# Patient Record
Sex: Female | Born: 1985 | Race: White | Hispanic: No | Marital: Married | Smoking: Never smoker
Health system: Southern US, Community
[De-identification: ages and names within clinical notes are randomized; demographics above are authoritative.]

## PROBLEM LIST (undated history)

## (undated) DIAGNOSIS — N946 Dysmenorrhea, unspecified: Secondary | ICD-10-CM

## (undated) DIAGNOSIS — F329 Major depressive disorder, single episode, unspecified: Secondary | ICD-10-CM

## (undated) DIAGNOSIS — Z8742 Personal history of other diseases of the female genital tract: Secondary | ICD-10-CM

## (undated) DIAGNOSIS — N920 Excessive and frequent menstruation with regular cycle: Secondary | ICD-10-CM

## (undated) DIAGNOSIS — F419 Anxiety disorder, unspecified: Secondary | ICD-10-CM

## (undated) DIAGNOSIS — F32A Depression, unspecified: Secondary | ICD-10-CM

## (undated) DIAGNOSIS — N941 Unspecified dyspareunia: Secondary | ICD-10-CM

## (undated) DIAGNOSIS — K219 Gastro-esophageal reflux disease without esophagitis: Secondary | ICD-10-CM

## (undated) DIAGNOSIS — Z87898 Personal history of other specified conditions: Secondary | ICD-10-CM

## (undated) DIAGNOSIS — R102 Pelvic and perineal pain: Secondary | ICD-10-CM

## (undated) DIAGNOSIS — Z8659 Personal history of other mental and behavioral disorders: Secondary | ICD-10-CM

## (undated) HISTORY — PX: WISDOM TOOTH EXTRACTION: SHX21

---
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
## 2010-01-11 ENCOUNTER — Emergency Department (HOSPITAL_BASED_OUTPATIENT_CLINIC_OR_DEPARTMENT_OTHER): Admission: EM | Admit: 2010-01-11 | Discharge: 2010-01-11 | Payer: Self-pay | Admitting: Emergency Medicine

## 2011-03-05 ENCOUNTER — Encounter: Payer: Self-pay | Admitting: Emergency Medicine

## 2011-03-05 ENCOUNTER — Emergency Department
Admission: EM | Admit: 2011-03-05 | Discharge: 2011-03-05 | Disposition: A | Discharge status: Home / Self Care | Payer: 59 | Source: Home / Self Care | Attending: Emergency Medicine | Admitting: Emergency Medicine

## 2011-03-05 DIAGNOSIS — N390 Urinary tract infection, site not specified: Secondary | ICD-10-CM

## 2011-03-05 LAB — POCT URINALYSIS DIP (MANUAL ENTRY)
Bilirubin, UA: NEGATIVE
Glucose, UA: NEGATIVE
Ketones, POC UA: NEGATIVE
Nitrite, UA: NEGATIVE
Protein Ur, POC: 100
Spec Grav, UA: >=1.03 (ref 1.005–1.03)
Urobilinogen, UA: 0.2 (ref 0–1)
pH, UA: 6 (ref 5–8)

## 2011-03-05 MED ORDER — CIPROFLOXACIN HCL 500 MG PO TABS: 500.0000 mg | ORAL_TABLET | Freq: Two times a day (BID) | ORAL | Status: AC

## 2011-03-05 MED ORDER — PHENAZOPYRIDINE HCL 200 MG PO TABS: ORAL_TABLET | ORAL | Status: AC

## 2011-03-05 NOTE — ED Provider Notes (Addendum)
History    This is a 26 y.o. female who presents today with UTI symptoms for 2 days.  + dysuria + frequency + urgency Positive, mild hematuria No vaginal discharge No fever/chills Positive, mild lower abdominal pain.  No nausea No vomiting  minimal back pain No fatigue She denies chance of pregnancy. Has tried over-the-counter measures without improvement.   CSN: [id number]  Arrival date & time 03/05/11  5284   First MD Initiated Contact with Patient 03/05/11 0820      Chief Complaint  Patient presents with  . Hematuria    (Consider location/radiation/quality/duration/timing/severity/associated sxs/prior treatment) HPI  History reviewed. No pertinent past medical history.  History reviewed. No pertinent past surgical history.  Family History  Problem Relation Age of Onset  . Cancer Mother     History  Substance Use Topics  . Smoking status: Not on file  . Smokeless tobacco: Not on file  . Alcohol Use:     OB History    Grav Para Term Preterm Abortions TAB SAB Ect Mult Living                  Review of Systems  Allergies  Penicillins  Home Medications   Current Outpatient Rx  Name Route Sig Dispense Refill  . DULOXETINE HCL 60 MG PO CPEP Oral Take 60 mg by mouth daily.    Azzie Roup ACE-ETH ESTRAD-FE 1-20 MG-MCG PO TABS Oral Take 1 tablet by mouth daily.    Marland Kitchen CIPROFLOXACIN HCL 500 MG PO TABS Oral Take 1 tablet (500 mg total) by mouth 2 (two) times daily. 14 tablet 0  . PHENAZOPYRIDINE HCL 200 MG PO TABS  Take 1 tablet by mouth every 6-8 hours if needed for urinary pain 8 tablet 0    BP 115/76  Pulse 73  Temp(Src) 98.5 F (36.9 C) (Oral)  Resp 12  Ht 5\' 7"  (1.702 m)  Wt 152 lb (68.947 kg)  BMI 23.81 kg/m2  SpO2 99%  LMP 02/19/2011  Physical Exam  Nursing note and vitals reviewed. Constitutional: She is oriented to person, place, and time. She appears well-developed and well-nourished. No distress.  HENT:  Mouth/Throat: Oropharynx is  clear and moist.  Eyes: No scleral icterus.  Neck: Neck supple.  Cardiovascular: Normal rate, regular rhythm and normal heart sounds.   Pulmonary/Chest: Breath sounds normal.  Abdominal: Soft. She exhibits no mass. There is no hepatosplenomegaly. There is tenderness in the suprapubic area. There is no rebound, no guarding and no CVA tenderness.  Lymphadenopathy:    She has no cervical adenopathy.  Neurological: She is alert and oriented to person, place, and time.  Skin: Skin is warm and dry.    ED Course  Procedures (including critical care time)   Labs Reviewed  POCT URINALYSIS DIP (MANUAL ENTRY)  URINE CULTURE     1. UTI (lower urinary tract infection)       MDM  No evidence of pyelonephritis or toxemia on exam. Advised to push fluids and other symptomatic care discussed. Cipro and Pyridium prescribed. Followup here or with PCP if no better in 3-4 days, sooner when necessary. Lonell Face, MD 03/05/11 [phone]  Addendum: At time of visit, UA was positive for blood protein and leukocytes. I reviewed this with patient and explained this was consistent with diagnosis of UTI. We'll send off urine culture which is pending.  Lonell Face, MD 03/05/11 [phone]

## 2011-03-05 NOTE — ED Notes (Signed)
Hematuria, dysuria, polyuria, LBP since last night

## 2011-03-07 LAB — URINE CULTURE
Colony Count: NO GROWTH
Organism ID, Bacteria: NO GROWTH

## 2011-04-09 ENCOUNTER — Encounter: Payer: Self-pay | Admitting: *Deleted

## 2011-04-09 ENCOUNTER — Emergency Department
Admission: EM | Admit: 2011-04-09 | Discharge: 2011-04-09 | Disposition: A | Discharge status: Home Health | Payer: 59 | Source: Home / Self Care | Attending: Emergency Medicine | Admitting: Emergency Medicine

## 2011-04-09 DIAGNOSIS — J069 Acute upper respiratory infection, unspecified: Secondary | ICD-10-CM

## 2011-04-09 HISTORY — DX: Depression, unspecified: F32.A

## 2011-04-09 HISTORY — DX: Major depressive disorder, single episode, unspecified: F32.9

## 2011-04-09 LAB — POCT RAPID STREP A (OFFICE): Rapid Strep A Screen: NEGATIVE

## 2011-04-09 MED ORDER — AZITHROMYCIN 250 MG PO TABS: ORAL_TABLET | ORAL | Status: AC

## 2011-04-09 NOTE — ED Notes (Signed)
Pt c/o sore throat, cough, chills, fatigue, nausea and body aches x Sunday. No OTC meds.

## 2011-04-09 NOTE — ED Provider Notes (Signed)
History     CSN: [id number]  Arrival date & time 04/09/11  0272   First MD Initiated Contact with Patient 04/09/11 [phone]      Chief Complaint  Patient presents with  . Cough  . Sore Throat    (Consider location/radiation/quality/duration/timing/severity/associated sxs/prior treatment) HPI Mackenzie Rodgers is a 26 y.o. female who complains of onset of cold symptoms for 3 days.  Did not have flu shot this year.  Has not been taking any meds.  Unknown sick contacts. + sore throat No cough No pleuritic pain No wheezing + nasal congestion + post-nasal drainage +  sinus pain/pressure No chest congestion No itchy/red eyes No earache No hemoptysis No SOB + chills/sweats/ fever (especially last night with hourly chills) No nausea No vomiting No abdominal pain No diarrhea No skin rashes + fatigue + myalgias No headache    Past Medical History  Diagnosis Date  . Depression     History reviewed. No pertinent past surgical history.  Family History  Problem Relation Age of Onset  . Cancer Mother     History  Substance Use Topics  . Smoking status: Never Smoker   . Smokeless tobacco: Not on file  . Alcohol Use: No    OB History    Grav Para Term Preterm Abortions TAB SAB Ect Mult Living                  Review of Systems  All other systems reviewed and are negative.    Allergies  Penicillins  Home Medications   Current Outpatient Rx  Name Route Sig Dispense Refill  . AZITHROMYCIN 250 MG PO TABS  Use as directed 1 each 0  . DULOXETINE HCL 60 MG PO CPEP Oral Take 60 mg by mouth daily.    Azzie Roup ACE-ETH ESTRAD-FE 1-20 MG-MCG PO TABS Oral Take 1 tablet by mouth daily.      BP 108/72  Pulse 92  Temp(Src) 98.5 F (36.9 C) (Oral)  Resp 16  Ht 5\' 7"  (1.702 m)  Wt 155 lb 8 oz (70.534 kg)  BMI 24.35 kg/m2  SpO2 98%  LMP 03/23/2011  Physical Exam  Nursing note and vitals reviewed. Constitutional: She is oriented to person, place, and time. She appears  well-developed and well-nourished.  HENT:  Head: Normocephalic and atraumatic.  Right Ear: Tympanic membrane, external ear and ear canal normal.  Left Ear: Tympanic membrane, external ear and ear canal normal.  Nose: Mucosal edema and rhinorrhea present.  Mouth/Throat: Posterior oropharyngeal erythema present. No oropharyngeal exudate or posterior oropharyngeal edema.  Eyes: No scleral icterus.  Neck: Neck supple.  Cardiovascular: Regular rhythm and normal heart sounds.   Pulmonary/Chest: Effort normal and breath sounds normal. No respiratory distress.  Neurological: She is alert and oriented to person, place, and time.  Skin: Skin is warm and dry.  Psychiatric: She has a normal mood and affect. Her speech is normal.    ED Course  Procedures (including critical care time)   Labs Reviewed  POCT RAPID STREP A (OFFICE)   No results found.   1. Acute upper respiratory infections of unspecified site   2. Influenza-like illness       MDM  1)  Take the prescribed antibiotic as instructed. Likely is still viral, so should wait a few days prior to taking Zpak.  Has been too long for Tamiflu to work.  Rapid strep negative. 2)  Use nasal saline solution (over the counter) at least 3 times a day. 3)  Use over the counter decongestants like Zyrtec-D every 12 hours as needed to help with congestion.  If you have hypertension, do not take medicines with sudafed.  4)  Can take tylenol every 6 hours or motrin every 8 hours for pain or fever. 5)  Follow up with your primary doctor if no improvement in 5-7 days, sooner if increasing pain, fever, or new symptoms.       Lily Kocher, MD 04/09/11 1131

## 2011-04-11 ENCOUNTER — Emergency Department
Admission: EM | Admit: 2011-04-11 | Discharge: 2011-04-11 | Disposition: A | Discharge status: Home Health | Payer: 59 | Source: Home / Self Care | Attending: Family Medicine | Admitting: Family Medicine

## 2011-04-11 ENCOUNTER — Encounter: Payer: Self-pay | Admitting: *Deleted

## 2011-04-11 DIAGNOSIS — J209 Acute bronchitis, unspecified: Secondary | ICD-10-CM

## 2011-04-11 MED ORDER — BENZONATATE 200 MG PO CAPS: 200.0000 mg | ORAL_CAPSULE | Freq: Every day | ORAL | Status: AC

## 2011-04-11 MED ORDER — CLARITHROMYCIN 500 MG PO TABS: 500.0000 mg | ORAL_TABLET | Freq: Two times a day (BID) | ORAL | Status: AC

## 2011-04-11 NOTE — Discharge Instructions (Signed)
Stop azithromycin. Take Mucinex D (guaifenesin with decongestant) twice daily for congestion.  Increase fluid intake, rest. May use Afrin nasal spray (or generic oxymetazoline) twice daily for about 5 days.  Also recommend using saline nasal spray several times daily and saline nasal irrigation (AYR is a common brand) Stop all antihistamines for now, and other non-prescription cough/cold preparations. May take Ibuprofen 200mg , 4 tabs every 8 hours with food for chest/sternum discomfort. Recommend Tdap when well.

## 2011-04-11 NOTE — ED Provider Notes (Signed)
History     CSN: [id number]  Arrival date & time 04/11/11  0941   First MD Initiated Contact with Patient 04/11/11 1010      Chief Complaint  Patient presents with  . Cough      HPI Comments: Patient complains of continued symptoms, including fatigue and nausea (without vomiting).  She now has increased sinus congestion and ears feel clogged.  She developed a generally non productive cough yesterday, worse at night.  She has occasional wheezing and shortness of breath with activity.  No pleuritic pain.  She does not remember her last tetanus shot.   The history is provided by the patient.    Past Medical History  Diagnosis Date  . Depression     History reviewed. No pertinent past surgical history.  Family History  Problem Relation Age of Onset  . Cancer Mother     History  Substance Use Topics  . Smoking status: Never Smoker   . Smokeless tobacco: Not on file  . Alcohol Use: No    OB History    Grav Para Term Preterm Abortions TAB SAB Ect Mult Living                  Review of Systems + sore throat, resolved + cough No pleuritic pain No wheezing + nasal congestion + post-nasal drainage No sinus pain/pressure No itchy/red eyes ? Earache (feel clogged) No hemoptysis No SOB No fever, + chills + nausea No vomiting No abdominal pain No diarrhea, but loose stools No urinary symptoms No skin rashes + fatigue No myalgias + headache Used OTC meds without relief  Allergies  Penicillins  Home Medications   Current Outpatient Rx  Name Route Sig Dispense Refill  . AZITHROMYCIN 250 MG PO TABS  Use as directed 1 each 0  . BENZONATATE 200 MG PO CAPS Oral Take 1 capsule (200 mg total) by mouth at bedtime. Take as needed for cough 12 capsule 0  . CLARITHROMYCIN 500 MG PO TABS Oral Take 1 tablet (500 mg total) by mouth 2 (two) times daily. Take for one week 14 tablet 0  . DULOXETINE HCL 60 MG PO CPEP Oral Take 60 mg by mouth daily.    Azzie Roup ACE-ETH  ESTRAD-FE 1-20 MG-MCG PO TABS Oral Take 1 tablet by mouth daily.      BP 107/71  Pulse 84  Temp(Src) 98.7 F (37.1 C) (Oral)  Resp 16  Ht 5\' 7"  (1.702 m)  Wt 155 lb 12 oz (70.648 kg)  BMI 24.39 kg/m2  SpO2 98%  LMP 03/23/2011  Physical Exam Nursing notes and Vital Signs reviewed. Appearance:  Patient appears healthy, stated age, and in no acute distress Eyes:  Pupils are equal, round, and reactive to light and accomodation.  Extraocular movement is intact.  Conjunctivae are not inflamed  Ears:  Canals normal.  Tympanic membranes normal.  Nose:  Mildly congested turbinates.  No sinus tenderness.   Pharynx:  Normal Neck:  Supple.  Slightly tender shotty posterior nodes are palpated bilaterally  Lungs:  Clear to auscultation.  Breath sounds are equal.  Chest:   Mild tenderness to palpation over the mid-sternum.  Heart:  Regular rate and rhythm without murmurs, rubs, or gallops.  Abdomen:  Nontender without masses or hepatosplenomegaly.  Bowel sounds are present.  No CVA or flank tenderness.  Extremities:  No edema.  No calf tenderness Skin:  No rash present.   ED Course  Procedures none  1. Acute bronchitis; ? pertussis      MDM  Begin Biaxin, and Tessalon at bedtime Stop azithromycin. Take Mucinex D (guaifenesin with decongestant) twice daily for congestion.  Increase fluid intake, rest. May use Afrin nasal spray (or generic oxymetazoline) twice daily for about 5 days.  Also recommend using saline nasal spray several times daily and saline nasal irrigation (AYR is a common brand) Stop all antihistamines for now, and other non-prescription cough/cold preparations. May take Ibuprofen 200mg , 4 tabs every 8 hours with food for chest/sternum discomfort. Recommend Tdap when well. Followup with PCP if not improved one week        Donna Christen, MD 04/12/11 [phone]

## 2011-04-11 NOTE — ED Notes (Signed)
Pt c/o dry cough, nasal congestion, HA,ears popping,and eyes hurt x 5 days. Was seen on 04/09/2011 by Dr Orson Aloe, still no improvement. She is on day 3 of a Zpak. She has also taken Tylenol cold.

## 2011-04-13 ENCOUNTER — Telehealth: Payer: Self-pay | Admitting: Family Medicine

## 2011-07-08 ENCOUNTER — Encounter (HOSPITAL_BASED_OUTPATIENT_CLINIC_OR_DEPARTMENT_OTHER): Payer: Self-pay | Admitting: *Deleted

## 2011-07-08 ENCOUNTER — Emergency Department (HOSPITAL_BASED_OUTPATIENT_CLINIC_OR_DEPARTMENT_OTHER)
Admission: EM | Admit: 2011-07-08 | Discharge: 2011-07-08 | Disposition: A | Discharge status: Home / Self Care | Payer: 59 | Attending: Emergency Medicine | Admitting: Emergency Medicine

## 2011-07-08 DIAGNOSIS — F3289 Other specified depressive episodes: Secondary | ICD-10-CM | POA: Insufficient documentation

## 2011-07-08 DIAGNOSIS — R112 Nausea with vomiting, unspecified: Secondary | ICD-10-CM | POA: Insufficient documentation

## 2011-07-08 DIAGNOSIS — F410 Panic disorder [episodic paroxysmal anxiety] without agoraphobia: Secondary | ICD-10-CM | POA: Insufficient documentation

## 2011-07-08 DIAGNOSIS — R197 Diarrhea, unspecified: Secondary | ICD-10-CM | POA: Insufficient documentation

## 2011-07-08 DIAGNOSIS — F329 Major depressive disorder, single episode, unspecified: Secondary | ICD-10-CM | POA: Insufficient documentation

## 2011-07-08 DIAGNOSIS — R110 Nausea: Secondary | ICD-10-CM

## 2011-07-08 LAB — COMPREHENSIVE METABOLIC PANEL
ALT: 11 U/L (ref 0–35)
AST: 19 U/L (ref 0–37)
Albumin: 3.7 g/dL (ref 3.5–5.2)
Alkaline Phosphatase: 53 U/L (ref 39–117)
BUN: 4 mg/dL — ABNORMAL LOW (ref 6–23)
CO2: 26 mEq/L (ref 19–32)
Calcium: 9 mg/dL (ref 8.4–10.5)
Chloride: 106 mEq/L (ref 96–112)
Creatinine, Ser: 0.7 mg/dL (ref 0.50–1.10)
GFR calc Af Amer: >90 mL/min (ref >90)
GFR calc non Af Amer: >90 mL/min (ref >90)
Glucose, Bld: 86 mg/dL (ref 70–99)
Potassium: 4.1 mEq/L (ref 3.5–5.1)
Sodium: 139 mEq/L (ref 135–145)
Total Bilirubin: 0.3 mg/dL (ref 0.3–1.2)
Total Protein: 7.3 g/dL (ref 6.0–8.3)

## 2011-07-08 LAB — URINALYSIS, ROUTINE W REFLEX MICROSCOPIC
Bilirubin Urine: NEGATIVE
Glucose, UA: NEGATIVE mg/dL
Hgb urine dipstick: NEGATIVE
Ketones, ur: NEGATIVE mg/dL
Leukocytes, UA: NEGATIVE
Nitrite: NEGATIVE
Protein, ur: NEGATIVE mg/dL
Specific Gravity, Urine: 1.006 (ref 1.005–1.030)
Urobilinogen, UA: 0.2 mg/dL (ref 0.0–1.0)
pH: 8 (ref 5.0–8.0)

## 2011-07-08 LAB — CBC
HCT: 37.4 % (ref 36.0–46.0)
Hemoglobin: 12.9 g/dL (ref 12.0–15.0)
MCH: 31.7 pg (ref 26.0–34.0)
MCHC: 34.5 g/dL (ref 30.0–36.0)
MCV: 91.9 fL (ref 78.0–100.0)
Platelets: 260 10*3/uL (ref 150–400)
RBC: 4.07 MIL/uL (ref 3.87–5.11)
RDW: 11.8 % (ref 11.5–15.5)
WBC: 6.2 10*3/uL (ref 4.0–10.5)

## 2011-07-08 LAB — DIFFERENTIAL
Basophils Absolute: 0 10*3/uL (ref 0.0–0.1)
Basophils Relative: 0 % (ref 0–1)
Eosinophils Absolute: 0 10*3/uL (ref 0.0–0.7)
Eosinophils Relative: 1 % (ref 0–5)
Lymphocytes Relative: 19 % (ref 12–46)
Lymphs Abs: 1.2 10*3/uL (ref 0.7–4.0)
Monocytes Absolute: 0.5 10*3/uL (ref 0.1–1.0)
Monocytes Relative: 9 % (ref 3–12)
Neutro Abs: 4.4 10*3/uL (ref 1.7–7.7)
Neutrophils Relative %: 71 % (ref 43–77)

## 2011-07-08 LAB — LIPASE, BLOOD: Lipase: 32 U/L (ref 11–59)

## 2011-07-08 LAB — PREGNANCY, URINE: Preg Test, Ur: NEGATIVE

## 2011-07-08 MED ORDER — ONDANSETRON HCL 4 MG/2ML IJ SOLN
4.0000 mg | Freq: Once | INTRAMUSCULAR | Status: AC
  Administered 2011-07-08: 4 mg via INTRAVENOUS
  Filled 2011-07-08: qty 2

## 2011-07-08 MED ORDER — ONDANSETRON 8 MG PO TBDP: 8.0000 mg | ORAL_TABLET | Freq: Three times a day (TID) | ORAL | Status: AC | PRN

## 2011-07-08 MED ORDER — SODIUM CHLORIDE 0.9 % IV BOLUS (SEPSIS)
1000.0000 mL | Freq: Once | INTRAVENOUS | Status: AC
  Administered 2011-07-08: 1000 mL via INTRAVENOUS

## 2011-07-08 NOTE — ED Provider Notes (Signed)
History     CSN: [id number]  Arrival date & time 07/08/11  7829   First MD Initiated Contact with Patient 07/08/11 1021      Chief Complaint  Patient presents with  . Nausea  . Diarrhea    (Consider location/radiation/quality/duration/timing/severity/associated sxs/prior treatment) HPI Patient is a 26 yo female who presents today complaining of nausea, vomiting, and diarrhea that began while camping.  No one else on the trip became ill.  Patient denies fevers but came in today as she felt that she must be dehydrated.  Also patient has a history of anxiety and this is often triggered by nausea.  Patient denies SI or HI.  The patient has no abdominal or back pain.  She denies cough, chest pain, or shortness of breath.  Patient denies any vaginal discharge or urinary symptoms.  Patient has not had vomiting or diarrhea for 12 hours.  She has had nausea and is currently still nauseas.  Patient reports not being able to tolerate po intake for the past 36 hours.There are no other associated or modifying factors.  Past Medical History  Diagnosis Date  . Depression   . Panic attacks   . Palpitations     negative workup  . Reflux     Past Surgical History  Procedure Date  . Wisdom tooth extraction     Family History  Problem Relation Age of Onset  . Cancer Mother     History  Substance Use Topics  . Smoking status: Never Smoker   . Smokeless tobacco: Not on file  . Alcohol Use: No    OB History    Grav Para Term Preterm Abortions TAB SAB Ect Mult Living                  Review of Systems  Constitutional: Negative.   HENT: Negative.   Eyes: Negative.   Respiratory: Negative.   Cardiovascular: Negative.   Gastrointestinal: Positive for nausea, vomiting and diarrhea.  Genitourinary: Negative.   Musculoskeletal: Negative.   Skin: Negative.   Neurological: Negative.   Hematological: Negative.   Psychiatric/Behavioral: Negative.   All other systems reviewed and are  negative.    Allergies  Penicillins  Home Medications   Current Outpatient Rx  Name Route Sig Dispense Refill  . ALPRAZOLAM 0.25 MG PO TABS Oral Take 0.25 mg by mouth at bedtime as needed.    . DULOXETINE HCL 60 MG PO CPEP Oral Take 60 mg by mouth daily.    Azzie Roup ACE-ETH ESTRAD-FE 1-20 MG-MCG PO TABS Oral Take 1 tablet by mouth daily.    Marland Kitchen ONDANSETRON 8 MG PO TBDP Oral Take 1 tablet (8 mg total) by mouth every 8 (eight) hours as needed for nausea. 20 tablet 0    BP 100/64  Pulse 92  Temp(Src) 98.7 F (37.1 C) (Oral)  Resp 20  SpO2 99%  LMP 07/07/2011  Physical Exam  Nursing note and vitals reviewed. GEN: Well-developed, well-nourished female in no distress HEENT: Atraumatic, normocephalic.  EYES: PERRLA BL, no scleral icterus. NECK: Trachea midline, no meningismus CV: regular rate and rhythm. No murmurs, rubs, or gallops PULM: No respiratory distress.  No crackles, wheezes, or rales. GI: soft, non-tender. No guarding, rebound, or tenderness. + bowel sounds  GU: deferred Neuro: cranial nerves 2-12 intact, no abnormalities of strength or sensation, A and O x 3 MSK: Patient moves all 4 extremities symmetrically, no deformity, edema, or injury noted Skin: No rashes petechiae, purpura, or jaundice Psych: anxious,  denies SI or HI   ED Course  Procedures (including critical care time)  Labs Reviewed  COMPREHENSIVE METABOLIC PANEL - Abnormal; Notable for the following:    BUN 4 (*)    All other components within normal limits  URINALYSIS, ROUTINE W REFLEX MICROSCOPIC  PREGNANCY, URINE  CBC  DIFFERENTIAL  LIPASE, BLOOD   No results found.   1. Nausea   2. Diarrhea       MDM  Patient was evaluated by myself.  She was concerned over possible dehydration but was hemodynamically stable.  She was given IV zofran and NS.  Patient had completely normal labs including CBC, CMP, lipase, urinalysis, and urine preg.  Patient required an additional dose of zofran but  was feeling better after this and able to tolerate po.  Patient was discharged home with a prescription for zofran as needed.  Patient was discharged in good condition.        Cyndra Numbers, MD 07/08/11 2306

## 2011-07-08 NOTE — Discharge Instructions (Signed)
Diarrhea Infections caused by germs (bacterial) or a virus commonly cause diarrhea. Your caregiver has determined that with time, rest and fluids, the diarrhea should improve. In general, eat normally while drinking more water than usual. Although water may prevent dehydration, it does not contain salt and minerals (electrolytes). Broths, weak tea without caffeine and oral rehydration solutions (ORS) replace fluids and electrolytes. Small amounts of fluids should be taken frequently. Large amounts at one time may not be tolerated. Plain water may be harmful in infants and the elderly. Oral rehydrating solutions (ORS) are available at pharmacies and grocery stores. ORS replace water and important electrolytes in proper proportions. Sports drinks are not as effective as ORS and may be harmful due to sugars worsening diarrhea.  ORS is especially recommended for use in children with diarrhea. As a general guideline for children, replace any new fluid losses from diarrhea and/or vomiting with ORS as follows:   If your child weighs 22 pounds or under (10 kg or less), give 60-120 mL ( -  cup or 2 - 4 ounces) of ORS for each episode of diarrheal stool or vomiting episode.   If your child weighs more than 22 pounds (more than 10 kgs), give 120-240 mL ( - 1 cup or 4 - 8 ounces) of ORS for each diarrheal stool or episode of vomiting.   While correcting for dehydration, children should eat normally. However, foods high in sugar should be avoided because this may worsen diarrhea. Large amounts of carbonated soft drinks, juice, gelatin desserts and other highly sugared drinks should be avoided.   After correction of dehydration, other liquids that are appealing to the child may be added. Children should drink small amounts of fluids frequently and fluids should be increased as tolerated. Children should drink enough fluids to keep urine clear or pale yellow.   Adults should eat normally while drinking more fluids  than usual. Drink small amounts of fluids frequently and increase as tolerated. Drink enough fluids to keep urine clear or pale yellow. Broths, weak decaffeinated tea, lemon lime soft drinks (allowed to go flat) and ORS replace fluids and electrolytes.   Avoid:   Carbonated drinks.   Juice.   Extremely hot or cold fluids.   Caffeine drinks.   Fatty, greasy foods.   Alcohol.   Tobacco.   Too much intake of anything at one time.   Gelatin desserts.   Probiotics are active cultures of beneficial bacteria. They may lessen the amount and number of diarrheal stools in adults. Probiotics can be found in yogurt with active cultures and in supplements.   Wash hands well to avoid spreading bacteria and virus.   Anti-diarrheal medications are not recommended for infants and children.   Only take over-the-counter or prescription medicines for pain, discomfort or fever as directed by your caregiver. Do not give aspirin to children because it may cause Reye's Syndrome.   For adults, ask your caregiver if you should continue all prescribed and over-the-counter medicines.   If your caregiver has given you a follow-up appointment, it is very important to keep that appointment. Not keeping the appointment could result in a chronic or permanent injury, and disability. If there is any problem keeping the appointment, you must call back to this facility for assistance.  SEEK IMMEDIATE MEDICAL CARE IF:   You or your child is unable to keep fluids down or other symptoms or problems become worse in spite of treatment.   Vomiting or diarrhea develops and becomes persistent.     There is vomiting of blood or bile (green material).   There is blood in the stool or the stools are black and tarry.   There is no urine output in 6-8 hours or there is only a small amount of very dark urine.   Abdominal pain develops, increases or localizes.   You have a fever.   Your baby is older than 3 months with a  rectal temperature of 102 F (38.9 C) or higher.   Your baby is 48 months old or younger with a rectal temperature of 100.4 F (38 C) or higher.   You or your child develops excessive weakness, dizziness, fainting or extreme thirst.   You or your child develops a rash, stiff neck, severe headache or become irritable or sleepy and difficult to awaken.  MAKE SURE YOU:   Understand these instructions.   Will watch your condition.   Will get help right away if you are not doing well or get worse.  Document Released: 01/17/2002 Document Revised: 01/16/2011 Document Reviewed: 12/04/2008 [facility] Patient Information 2012 [place], [place].Nausea, Adult Nausea is the feeling that you have an upset stomach or have to vomit. Nausea by itself is not likely a serious concern, but it may be an early sign of more serious medical problems. As nausea gets worse, it can lead to vomiting. If vomiting develops, there is the risk of dehydration.  CAUSES   Viral infections.   Food poisoning.   Medicines.   Pregnancy.   Motion sickness.   Migraine headaches.   Emotional distress.   Severe pain from any source.   Alcohol intoxication.  HOME CARE INSTRUCTIONS  Get plenty of rest.   Ask your caregiver about specific rehydration instructions.   Eat small amounts of food and sip liquids more often.   Take all medicines as told by your caregiver.  SEEK MEDICAL CARE IF:  You have not improved after 2 days, or you get worse.   You have a headache.  SEEK IMMEDIATE MEDICAL CARE IF:   You have a fever.   You faint.   You keep vomiting or have blood in your vomit.   You are extremely weak or dehydrated.   You have dark or bloody stools.   You have severe chest or abdominal pain.  MAKE SURE YOU:  Understand these instructions.   Will watch your condition.   Will get help right away if you are not doing well or get worse.  Document Released: 03/06/2004 Document Revised: 01/16/2011  Document Reviewed: 10/09/2010 [facility] Patient Information 2012 [place], [place].

## 2011-07-08 NOTE — ED Notes (Signed)
Patient states she was camping on Saturday night and developed a sudden onset of nausea, vomiting and diarrhea.  Came home and continued with diarrhea on Sunday morning.  States she has had very little liquids since due to feeling like she like she is going to vomit.  States all of this had caused her to have a panic attack.  States today, she feels like she is going to pass out and that her nerves will not let her eat or drink anything.

## 2012-05-27 ENCOUNTER — Emergency Department
Admission: EM | Admit: 2012-05-27 | Discharge: 2012-05-27 | Disposition: A | Discharge status: Home / Self Care | Payer: 59 | Source: Home / Self Care | Attending: Family Medicine | Admitting: Family Medicine

## 2012-05-27 ENCOUNTER — Encounter: Payer: Self-pay | Admitting: Emergency Medicine

## 2012-05-27 DIAGNOSIS — N3000 Acute cystitis without hematuria: Secondary | ICD-10-CM

## 2012-05-27 DIAGNOSIS — R300 Dysuria: Secondary | ICD-10-CM

## 2012-05-27 LAB — POCT URINALYSIS DIP (MANUAL ENTRY)

## 2012-05-27 LAB — POCT URINE PREGNANCY: Preg Test, Ur: NEGATIVE

## 2012-05-27 MED ORDER — NITROFURANTOIN MONOHYD MACRO 100 MG PO CAPS: 100.0000 mg | ORAL_CAPSULE | Freq: Two times a day (BID) | ORAL | Status: DC

## 2012-05-27 MED ORDER — PHENAZOPYRIDINE HCL 200 MG PO TABS: 200.0000 mg | ORAL_TABLET | Freq: Three times a day (TID) | ORAL | Status: DC

## 2012-05-27 NOTE — ED Notes (Signed)
Dysuria, polyuria, x 3 days

## 2012-05-27 NOTE — ED Provider Notes (Signed)
History     CSN: [id number]  Arrival date & time 05/27/12  1818   First MD Initiated Contact with Patient 05/27/12 1850      Chief Complaint  Patient presents with  . Dysuria       HPI Comments: Patient complains of dysuria, frequency, urgency, and hesitancy for about 4 to 5 days, becoming worse.  No nocturia.  No abdominal pain.  No fevers, chills, and sweats   Patient is a 27 y.o. female presenting with dysuria. The history is provided by the patient.  Dysuria  This is a new problem. Episode onset: 4 to 5 days ago. The problem occurs every urination. The problem has been gradually worsening. The quality of the pain is described as burning. The pain is mild. There has been no fever. There is no history of pyelonephritis. Associated symptoms include frequency, hesitancy, urgency and flank pain. Pertinent negatives include no chills, no sweats, no nausea, no vomiting, no discharge and no hematuria. Treatments tried: Pyridium. Her past medical history does not include recurrent UTIs.    Past Medical History  Diagnosis Date  . Depression   . Panic attacks   . Palpitations     negative workup  . Reflux     Past Surgical History  Procedure Laterality Date  . Wisdom tooth extraction      Family History  Problem Relation Age of Onset  . Cancer Mother     History  Substance Use Topics  . Smoking status: Never Smoker   . Smokeless tobacco: Not on file  . Alcohol Use: No    OB History   Grav Para Term Preterm Abortions TAB SAB Ect Mult Living                  Review of Systems  Constitutional: Negative for chills.  Gastrointestinal: Negative for nausea and vomiting.  Genitourinary: Positive for dysuria, hesitancy, urgency, frequency and flank pain. Negative for hematuria.  All other systems reviewed and are negative.    Allergies  Penicillins  Home Medications   Current Outpatient Rx  Name  Route  Sig  Dispense  Refill  . ALPRAZolam (XANAX) 0.25 MG tablet  Oral   Take 0.25 mg by mouth at bedtime as needed.         . DULoxetine (CYMBALTA) 60 MG capsule   Oral   Take 60 mg by mouth daily.         . nitrofurantoin, macrocrystal-monohydrate, (MACROBID) 100 MG capsule   Oral   Take 1 capsule (100 mg total) by mouth 2 (two) times daily.   14 capsule   0   . norethindrone-ethinyl estradiol (JUNEL FE,GILDESS FE,LOESTRIN FE) 1-20 MG-MCG tablet   Oral   Take 1 tablet by mouth daily.         . phenazopyridine (PYRIDIUM) 200 MG tablet   Oral   Take 1 tablet (200 mg total) by mouth 3 (three) times daily. Take with food.   6 tablet   0     BP 117/80  Pulse 67  Temp(Src) 98.2 F (36.8 C) (Oral)  Ht 5\' 7"  (1.702 m)  Wt 136 lb (61.689 kg)  BMI 21.3 kg/m2  SpO2 99%  Physical Exam Nursing notes and Vital Signs reviewed. Appearance:  Patient appears healthy, stated age, and in no acute distress Eyes:  Pupils are equal, round, and reactive to light and accomodation.  Extraocular movement is intact.  Conjunctivae are not inflamed  Pharynx:  Normal; moist mucous membranes  Neck:  Supple.  No adenopathy Lungs:  Clear to auscultation.  Breath sounds are equal.  Heart:  Regular rate and rhythm without murmurs, rubs, or gallops.  Abdomen:  Nontender without masses or hepatosplenomegaly.  Bowel sounds are present.  No CVA or flank tenderness.  Skin:  No rash present.   ED Course  Procedures  none  Labs Reviewed  POCT URINALYSIS DIP (MANUAL ENTRY) GLU 100mg /dL; KET 15mg /dL; BLO moderate; PRO 30mg /dL; NIT positive; LEU large  POCT URINE PREGNANCY negative      1. Dysuria   2. Acute cystitis       MDM  Urine culture pending Begin Macrobid.  Pyridium for two days.  Increase fluid intake. Followup with Family Doctor if not improved in one week.  If symptoms become significantly worse during the night or over the weekend, proceed to the local emergency room.         Lattie Haw, MD 05/27/12 [phone]

## 2012-05-29 ENCOUNTER — Telehealth: Payer: Self-pay | Admitting: *Deleted

## 2012-05-29 LAB — URINE CULTURE
Colony Count: NO GROWTH
Organism ID, Bacteria: NO GROWTH

## 2012-06-15 ENCOUNTER — Telehealth: Payer: Self-pay | Admitting: *Deleted

## 2012-06-18 ENCOUNTER — Encounter: Payer: Self-pay | Admitting: *Deleted

## 2012-06-18 ENCOUNTER — Emergency Department
Admission: EM | Admit: 2012-06-18 | Discharge: 2012-06-18 | Disposition: A | Discharge status: Home / Self Care | Payer: 59 | Source: Home / Self Care | Attending: Family Medicine | Admitting: Family Medicine

## 2012-06-18 DIAGNOSIS — R319 Hematuria, unspecified: Secondary | ICD-10-CM

## 2012-06-18 DIAGNOSIS — R300 Dysuria: Secondary | ICD-10-CM

## 2012-06-18 LAB — CBC WITH DIFFERENTIAL/PLATELET
Basophils Absolute: 0 10*3/uL (ref 0.0–0.1)
Basophils Relative: 0 % (ref 0–1)
Eosinophils Absolute: 0 10*3/uL (ref 0.0–0.7)
Eosinophils Relative: 1 % (ref 0–5)
HCT: 38.5 % (ref 36.0–46.0)
Hemoglobin: 13.2 g/dL (ref 12.0–15.0)
Lymphocytes Relative: 16 % (ref 12–46)
Lymphs Abs: 1.4 10*3/uL (ref 0.7–4.0)
MCH: 31.6 pg (ref 26.0–34.0)
MCHC: 34.3 g/dL (ref 30.0–36.0)
MCV: 92.1 fL (ref 78.0–100.0)
Monocytes Absolute: 0.6 10*3/uL (ref 0.1–1.0)
Monocytes Relative: 7 % (ref 3–12)
Neutro Abs: 6.7 10*3/uL (ref 1.7–7.7)
Neutrophils Relative %: 76 % (ref 43–77)
Platelets: 281 10*3/uL (ref 150–400)
RBC: 4.18 MIL/uL (ref 3.87–5.11)
RDW: 12.7 % (ref 11.5–15.5)
WBC: 8.8 10*3/uL (ref 4.0–10.5)

## 2012-06-18 LAB — POCT URINALYSIS DIP (MANUAL ENTRY)
Glucose, UA: NEGATIVE
Nitrite, UA: NEGATIVE
Protein Ur, POC: >=300
Spec Grav, UA: >=1.03 (ref 1.005–1.03)
Urobilinogen, UA: 0.2 (ref 0–1)
pH, UA: 6 (ref 5–8)

## 2012-06-18 LAB — COMPLETE METABOLIC PANEL WITH GFR
ALT: 14 U/L (ref 0–35)
AST: 16 U/L (ref 0–37)
Albumin: 4.4 g/dL (ref 3.5–5.2)
Alkaline Phosphatase: 51 U/L (ref 39–117)
BUN: 9 mg/dL (ref 6–23)
CO2: 26 mEq/L (ref 19–32)
Calcium: 9.6 mg/dL (ref 8.4–10.5)
Chloride: 103 mEq/L (ref 96–112)
Creat: 0.93 mg/dL (ref 0.50–1.10)
GFR, Est African American: >89 mL/min
GFR, Est Non African American: 86 mL/min
Glucose, Bld: 72 mg/dL (ref 70–99)
Potassium: 4.6 mEq/L (ref 3.5–5.3)
Sodium: 140 mEq/L (ref 135–145)
Total Bilirubin: 0.8 mg/dL (ref 0.3–1.2)
Total Protein: 7.1 g/dL (ref 6.0–8.3)

## 2012-06-18 MED ORDER — PHENAZOPYRIDINE HCL 200 MG PO TABS: 200.0000 mg | ORAL_TABLET | Freq: Three times a day (TID) | ORAL | Status: AC

## 2012-06-18 NOTE — ED Notes (Signed)
Pt c/o dysuria, frequency and urgency x 4 days. Denies fever.  

## 2012-06-18 NOTE — ED Provider Notes (Signed)
History     CSN: [id number]  Arrival date & time 06/18/12  0815   None     Chief Complaint  Patient presents with  . Urinary Frequency  . Dysuria       HPI Comments: Patient noticed blood in her urine about 3 to 4 days ago without discomfort.  Over the past two days she has developed dysuria, urgency, and hesitancy, and urine has been darker.  She feels well otherwise.  No abdominal pain.  No fevers, chills, and sweats.  Patient's last menstrual period was 06/09/2012 and normal (now resolved) She recalls that her previous UTI symptoms in April partly improved while taking Macrobid, then recurred before resolving.  Note that urine cultures 03/07/11 and 05/29/12 were negative.  Patient is a 27 y.o. female presenting with dysuria. The history is provided by the patient.  Dysuria  This is a recurrent problem. The current episode started 2 days ago. The problem occurs every urination. The problem has not changed since onset.The quality of the pain is described as burning. The pain is mild. There has been no fever. Associated symptoms include frequency, hematuria, hesitancy and urgency. Pertinent negatives include no chills, no sweats, no nausea, no vomiting, no discharge and no flank pain. She has tried nothing for the symptoms.    Past Medical History  Diagnosis Date  . Depression   . Panic attacks   . Palpitations     negative workup  . Reflux     Past Surgical History  Procedure Laterality Date  . Wisdom tooth extraction      Family History  Problem Relation Age of Onset  . Cancer Mother     History  Substance Use Topics  . Smoking status: Never Smoker   . Smokeless tobacco: Not on file  . Alcohol Use: No    OB History   Grav Para Term Preterm Abortions TAB SAB Ect Mult Living                  Review of Systems  Constitutional: Negative for chills.  Gastrointestinal: Negative for nausea and vomiting.  Genitourinary: Positive for dysuria, hesitancy, urgency,  frequency and hematuria. Negative for flank pain.    Allergies  Penicillins  Home Medications   Current Outpatient Rx  Name  Route  Sig  Dispense  Refill  . ALPRAZolam (XANAX) 0.25 MG tablet   Oral   Take 0.25 mg by mouth at bedtime as needed.         . DULoxetine (CYMBALTA) 60 MG capsule   Oral   Take 60 mg by mouth daily.         . nitrofurantoin, macrocrystal-monohydrate, (MACROBID) 100 MG capsule   Oral   Take 1 capsule (100 mg total) by mouth 2 (two) times daily.   14 capsule   0   . norethindrone-ethinyl estradiol (JUNEL FE,GILDESS FE,LOESTRIN FE) 1-20 MG-MCG tablet   Oral   Take 1 tablet by mouth daily.         . phenazopyridine (PYRIDIUM) 200 MG tablet   Oral   Take 1 tablet (200 mg total) by mouth 3 (three) times daily. Take with food.   6 tablet   0     BP 107/72  Pulse 79  Temp(Src) 97.8 F (36.6 C) (Oral)  Resp 18  Ht 5\' 7"  (1.702 m)  Wt 135 lb (61.236 kg)  BMI 21.14 kg/m2  SpO2 100%  LMP 06/09/2012  Physical Exam Nursing notes and Vital Signs reviewed.  Appearance:  Patient appears healthy, stated age, and in no acute distress Eyes:  Pupils are equal, round, and reactive to light and accomodation.  Extraocular movement is intact.  Conjunctivae are not inflamed  Pharynx:  Normal Neck:  Supple.   No adenopathy Lungs:  Clear to auscultation.  Breath sounds are equal.  Heart:  Regular rate and rhythm without murmurs, rubs, or gallops.  Abdomen:  Mild tenderness over bladder without masses or hepatosplenomegaly.  Bowel sounds are present.  Mild bilateral flank tenderness present. Extremities:  No edema.  No calf tenderness Skin:  No rash present.   ED Course  Procedures  none  Labs Reviewed  URINE CULTURE pending  POCT URINALYSIS DIP (MANUAL ENTRY):  BIL small; KET trace; SG >= 1.030; BLO large; PRO >=300mg /dL; NIT negative; LEU small Urine micro:  RBC TNTC, rare WBC, rare epith      1. Dysuria   2. Hematuria ?Etiology      MDM   Rx for Pyridium for two days. Urine culture pending.  Check renal function with CMP.  CBC. Refer to urologist for further evaluation        Lattie Haw, MD 06/18/12 [phone]

## 2012-06-20 LAB — URINE CULTURE
Colony Count: NO GROWTH
Organism ID, Bacteria: NO GROWTH

## 2012-06-21 ENCOUNTER — Telehealth: Payer: Self-pay | Admitting: *Deleted

## 2012-10-01 ENCOUNTER — Other Ambulatory Visit: Payer: Self-pay | Admitting: Orthopaedic Surgery

## 2012-10-01 DIAGNOSIS — M5136 Other intervertebral disc degeneration, lumbar region: Secondary | ICD-10-CM

## 2012-10-25 ENCOUNTER — Ambulatory Visit
Admission: RE | Admit: 2012-10-25 | Discharge: 2012-10-25 | Disposition: A | Discharge status: Home / Self Care | Payer: 59 | Source: Ambulatory Visit | Attending: Orthopaedic Surgery | Admitting: Orthopaedic Surgery

## 2012-10-25 DIAGNOSIS — M5136 Other intervertebral disc degeneration, lumbar region: Secondary | ICD-10-CM

## 2016-03-31 NOTE — H&P (Signed)
Patient name Mackenzie NielsenStrickland, [name] CSN# [id number]  Juluis MireMCCOMB,Stephanee Barcomb S, MD 03/31/2016 9:03 AM

## 2016-03-31 NOTE — H&P (Signed)
NAME:  Mackenzie Rodgers, Abrey          ACCOUNT NO.:  [id number]  MEDICAL RECORD NO.:  [id number]  LOCATION:                                 FACILITY:  PHYSICIAN:  Juluis MireJohn S. Izaiah Tabb, M.D.        DATE OF BIRTH:  DATE OF ADMISSION: DATE OF DISCHARGE:                             HISTORY & PHYSICAL   The date of her surgery is March 2, [facility] on [address].  HISTORY OF PRESENT ILLNESS:  The patient is a 31 year old nulligravida married female who presents for laparoscopy as well as hysteroscopy. The patient has been describing her periods as extremely painful and heavy.  She is also having increasing pain with intercourse.  She is trying to get pregnant at the present time.  One concern is that this may be endometriosis.  The patient does have a history of infertility. We did do an ultrasound in the office that was unremarkable.  We discussed the possibility of endometriosis.  In view of the worsening symptoms, they are becoming limiting.  We are going to now proceed with laparoscopy, hysteroscopy, and chromohydrotubation.  The nature of the procedures have been discussed.  ALLERGIES:  She is allergic to PENICILLIN.  MEDICATIONS:  None.  PAST MEDICAL HISTORY:  Usual childhood diseases without any significant sequelae.  PAST SURGICAL HISTORY:  No surgical history.  SOCIAL HISTORY:  Reveals no tobacco and only occasional alcohol use.  FAMILY HISTORY:  Significant for history of breast cancer.  REVIEW OF SYSTEMS:  Noncontributory.  PHYSICAL EXAMINATION:  GENERAL:  The patient is afebrile.  Stable vital signs. HEENT:  The patient is normocephalic.  Pupils equal, round, and reactive to light and accommodation.  Extraocular movements were intact.  Clear sclerae.  Conjunctivae were clear. LUNGS:  Clear. CARDIOVASCULAR SYSTEM:  Regular rhythm and rate, without murmurs or gallops.  No carotid or abdominal bruits. ABDOMEN:  Benign.  No mass, organomegaly,  or tenderness. PELVIS:  Normal external genitalia.  Vaginal mucosa is clear.  Cervix unremarkable.  Uterus, normal size and shape.  Moderate tenderness. Adnexa unremarkable. EXTREMITIES:  Trace edema. NEUROLOGIC:  Grossly within normal limits.  IMPRESSION: 1. Worsening menorrhagia, dysmenorrhea, and pelvic pain. 2. Deep dyspareunia. 3. History of infertility.  PLAN:  The patient to undergo diagnostic laparoscopy along with hysteroscopy and chromohydrotubation.  The risks of surgery have been discussed including the risk of infection.  The risk of hemorrhage could require transfusion with the risk of AIDS or hepatitis.  Excessive bleeding could require hysterectomy.  Risk of injury to adjacent organs such as bowel, bladder, this can require exploratory surgery for management.  Risk of deep venous thrombosis and pulmonary embolus.  The patient expressed understanding of indications and risks.     Juluis MireJohn S. Haitham Dolinsky, M.D.     JSM/MEDQ  D:  03/31/2016  T:  03/31/2016  Job:  [id number]

## 2016-04-03 ENCOUNTER — Encounter (HOSPITAL_BASED_OUTPATIENT_CLINIC_OR_DEPARTMENT_OTHER): Payer: Self-pay | Admitting: *Deleted

## 2016-05-02 ENCOUNTER — Ambulatory Visit (HOSPITAL_BASED_OUTPATIENT_CLINIC_OR_DEPARTMENT_OTHER): Admit: 2016-05-02 | Payer: Self-pay | Admitting: Obstetrics and Gynecology

## 2016-05-02 ENCOUNTER — Encounter (HOSPITAL_BASED_OUTPATIENT_CLINIC_OR_DEPARTMENT_OTHER): Payer: Self-pay

## 2016-05-02 HISTORY — DX: Gastro-esophageal reflux disease without esophagitis: K21.9

## 2016-05-02 HISTORY — DX: Personal history of other diseases of the female genital tract: Z87.42

## 2016-05-02 HISTORY — DX: Unspecified dyspareunia: N94.10

## 2016-05-02 HISTORY — DX: Pelvic and perineal pain: R10.2

## 2016-05-02 HISTORY — DX: Personal history of other mental and behavioral disorders: Z86.59

## 2016-05-02 HISTORY — DX: Anxiety disorder, unspecified: F41.9

## 2016-05-02 HISTORY — DX: Dysmenorrhea, unspecified: N94.6

## 2016-05-02 HISTORY — DX: Excessive and frequent menstruation with regular cycle: N92.0

## 2016-05-02 HISTORY — DX: Personal history of other specified conditions: Z87.898

## 2016-05-02 SURGERY — LAPAROSCOPY, DIAGNOSTIC
Anesthesia: General

## 2017-05-04 NOTE — H&P (Signed)
Patient name Mackenzie Rodgers, Mackenzie Rodgers DICTATION# [id number] CSN# [id number]  Mackenzie Rodgers,Malachy Coleman S, MD 05/04/2017 1:22 PM

## 2017-05-05 NOTE — H&P (Signed)
NAME:  Mackenzie Rodgers, Mackenzie Rodgers         ACCOUNT NO.:  [id number]  MEDICAL RECORD NO.:  [id number]  LOCATION:                                 FACILITY:  PHYSICIAN:  Juluis MireJohn S. Nour Rodrigues, M.D.        DATE OF BIRTH:  DATE OF ADMISSION: DATE OF DISCHARGE:                             HISTORY & PHYSICAL   The date of her surgery is April 12 at [facility].  HISTORY OF PRESENT ILLNESS:  The patient is a 32 year old nulligravida female, who presents for diagnostic laparoscopy and chromo hydrotubation.  She has a history of primary infertility.  She has had continued problems with painful intercourse and discomfort after intercourse with orgasm.  This has become a limiting issue.  She has undergone previous ultrasounds that have been unremarkable.  She now proceeds for diagnostic laparoscopy.  In view of infertility issues, chromo hydrotubation.  ALLERGIES:  The patient is allergic to penicillin.  MEDICATIONS:  Include Xanax.  PAST MEDICAL HISTORY: 1. History of migraine headaches. 2. Urinary tract infections.  PAST SURGICAL HISTORY:  No previous surgery.  SOCIAL HISTORY:  Reveals no tobacco and some alcohol use.  FAMILY HISTORY:  Noncontributory.  REVIEW OF SYSTEMS:  Noncontributory.  PHYSICAL EXAMINATION:  VITAL SIGNS:  The patient is afebrile.  Stable vital signs. HEENT:  The patient is normocephalic.  Pupils are equal, round, and reactive to light and accommodation. LUNGS:  Clear. CARDIOVASCULAR:  Regular rate.  No murmurs or gallops. ABDOMEN:  Benign.  No mass, organomegaly, or tenderness. PELVIC:  Normal external genitalia.  Vaginal cuff is clear.  Cervix unremarkable.  Uterus normal size, shape, contour.  Adnexa free of masses or tenderness. EXTREMITIES:  Trace edema. NEUROLOGIC:  Grossly within normal limits.  IMPRESSION: 1. Dyspareunia and pelvic pain. 2. Primary infertility.  PLAN:  The patient will undergo diagnostic laparoscopy to evaluate  for such conditions, endometriosis, or pelvic adhesions.  Chromo Hydrotubation will be performed.  The nature of procedure and risks have been discussed including the risk of infection.  The risks of hemorrhage that could require transfusion with the risk of AIDS or hepatitis. Excessive intraabdominal bleeding could require exploratory surgery. Risk of injury to adjacent organs including bladder, bowel, ureters that could require further exploratory surgery.  Risk of deep venous thrombosis and pulmonary embolus.  The patient does understand indications and risks.     Juluis MireJohn S. Ahmani Daoud, M.D.   ______________________________ Juluis MireJohn S. Corona Popovich, M.D.    JSM/MEDQ  D:  05/04/2017  T:  05/04/2017  Job:  [id number]

## 2017-05-18 ENCOUNTER — Other Ambulatory Visit: Payer: Self-pay | Admitting: Obstetrics and Gynecology

## 2017-05-18 DIAGNOSIS — R928 Other abnormal and inconclusive findings on diagnostic imaging of breast: Secondary | ICD-10-CM

## 2017-05-22 ENCOUNTER — Ambulatory Visit (HOSPITAL_BASED_OUTPATIENT_CLINIC_OR_DEPARTMENT_OTHER): Admission: RE | Admit: 2017-05-22 | Payer: Self-pay | Source: Ambulatory Visit | Admitting: Obstetrics and Gynecology

## 2017-05-22 ENCOUNTER — Encounter (HOSPITAL_BASED_OUTPATIENT_CLINIC_OR_DEPARTMENT_OTHER): Admission: RE | Payer: Self-pay | Source: Ambulatory Visit

## 2017-05-22 SURGERY — LAPAROSCOPY, DIAGNOSTIC
Anesthesia: General

## 2018-03-18 ENCOUNTER — Other Ambulatory Visit: Payer: Self-pay | Admitting: Rehabilitation

## 2018-03-18 DIAGNOSIS — M542 Cervicalgia: Secondary | ICD-10-CM

## 2018-03-22 ENCOUNTER — Ambulatory Visit
Admission: RE | Admit: 2018-03-22 | Discharge: 2018-03-22 | Disposition: A | Discharge status: Home / Self Care | Payer: BLUE CROSS/BLUE SHIELD | Source: Ambulatory Visit | Attending: Rehabilitation | Admitting: Rehabilitation

## 2018-03-22 DIAGNOSIS — M542 Cervicalgia: Secondary | ICD-10-CM

## 2018-12-16 ENCOUNTER — Other Ambulatory Visit: Payer: Self-pay | Admitting: Obstetrics and Gynecology

## 2018-12-16 DIAGNOSIS — N644 Mastodynia: Secondary | ICD-10-CM

## 2018-12-27 ENCOUNTER — Other Ambulatory Visit: Payer: BLUE CROSS/BLUE SHIELD

## 2018-12-28 ENCOUNTER — Other Ambulatory Visit: Payer: Self-pay

## 2019-01-14 ENCOUNTER — Other Ambulatory Visit: Payer: Self-pay | Admitting: Obstetrics and Gynecology

## 2019-01-14 ENCOUNTER — Other Ambulatory Visit: Payer: Self-pay

## 2019-01-14 ENCOUNTER — Ambulatory Visit
Admission: RE | Admit: 2019-01-14 | Discharge: 2019-01-14 | Disposition: A | Discharge status: Home / Self Care | Payer: BC Managed Care – PPO | Source: Ambulatory Visit | Attending: Obstetrics and Gynecology | Admitting: Obstetrics and Gynecology

## 2019-01-14 DIAGNOSIS — N6310 Unspecified lump in the right breast, unspecified quadrant: Secondary | ICD-10-CM

## 2019-01-14 DIAGNOSIS — N644 Mastodynia: Secondary | ICD-10-CM

## 2019-01-28 ENCOUNTER — Other Ambulatory Visit: Payer: BC Managed Care – PPO

## 2020-05-04 ENCOUNTER — Other Ambulatory Visit: Payer: Self-pay | Admitting: Obstetrics & Gynecology

## 2020-05-04 DIAGNOSIS — R9389 Abnormal findings on diagnostic imaging of other specified body structures: Secondary | ICD-10-CM

## 2020-05-10 ENCOUNTER — Ambulatory Visit
Admission: RE | Admit: 2020-05-10 | Discharge: 2020-05-10 | Disposition: A | Discharge status: Home / Self Care | Payer: BC Managed Care – PPO | Source: Ambulatory Visit | Attending: Obstetrics & Gynecology | Admitting: Obstetrics & Gynecology

## 2020-05-10 ENCOUNTER — Ambulatory Visit: Payer: BC Managed Care – PPO

## 2020-05-10 ENCOUNTER — Other Ambulatory Visit: Payer: Self-pay

## 2020-05-10 ENCOUNTER — Other Ambulatory Visit: Payer: Self-pay | Admitting: Obstetrics & Gynecology

## 2020-05-10 DIAGNOSIS — R9389 Abnormal findings on diagnostic imaging of other specified body structures: Secondary | ICD-10-CM

## 2020-05-10 MED ORDER — GADOBUTROL 1 MMOL/ML IV SOLN
8.0000 mL | Freq: Once | INTRAVENOUS | Status: AC | PRN
  Administered 2020-05-10: 8 mL via INTRAVENOUS

## 2020-05-16 ENCOUNTER — Ambulatory Visit
Admission: RE | Admit: 2020-05-16 | Discharge: 2020-05-16 | Disposition: A | Discharge status: Home / Self Care | Payer: BC Managed Care – PPO | Source: Ambulatory Visit | Attending: Obstetrics & Gynecology | Admitting: Obstetrics & Gynecology

## 2020-05-16 ENCOUNTER — Other Ambulatory Visit: Payer: Self-pay

## 2020-05-16 ENCOUNTER — Other Ambulatory Visit: Payer: Self-pay | Admitting: Diagnostic Radiology

## 2020-05-16 DIAGNOSIS — R9389 Abnormal findings on diagnostic imaging of other specified body structures: Secondary | ICD-10-CM

## 2020-05-16 MED ORDER — GADOBUTROL 1 MMOL/ML IV SOLN
6.0000 mL | Freq: Once | INTRAVENOUS | Status: AC | PRN
  Administered 2020-05-16: 6 mL via INTRAVENOUS

## 2020-12-23 IMAGING — MG DIGITAL DIAGNOSTIC BILAT W/ TOMO W/ CAD
6 of 12 series · 6 of 32 positions shown · non-contrast
Comparison: Screening study on 05/14/2017;
COMPARISON: Screening study on 05/14/2017;

Addendum:
CLINICAL DATA: Focal pain in the LEFT breast.

[R ML]
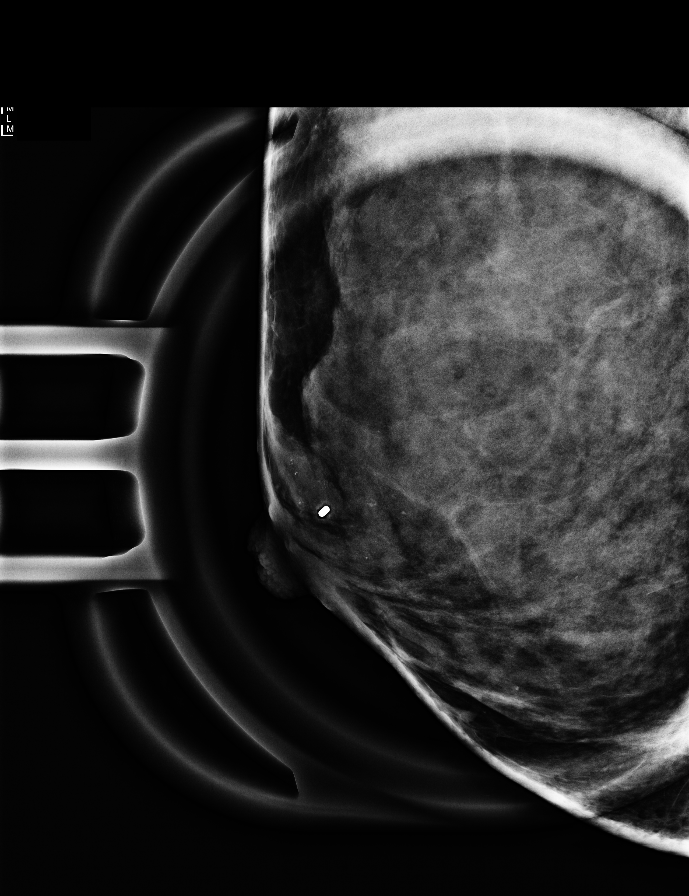

[R CC]
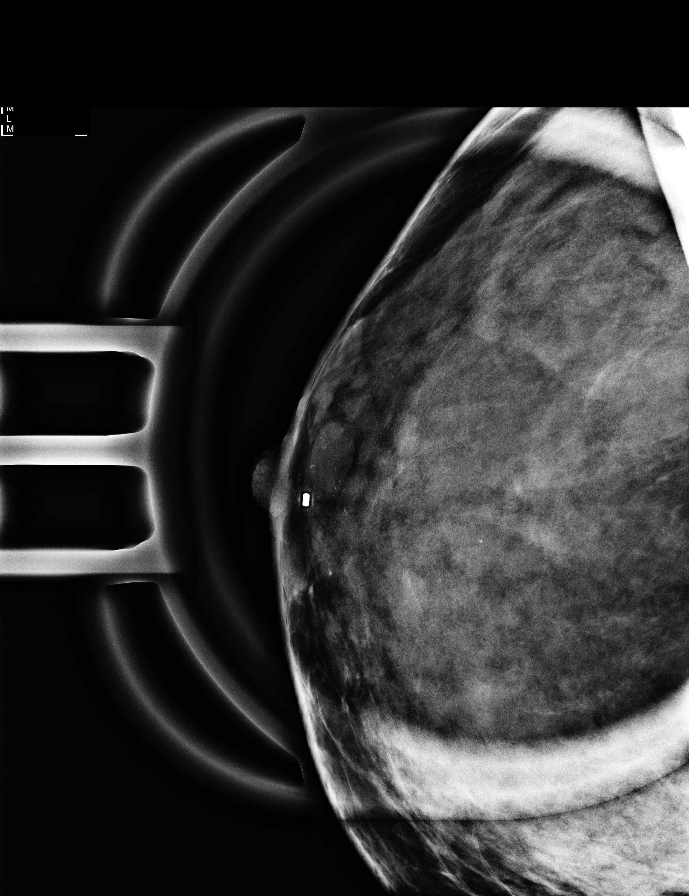

[R CC synth-2D]
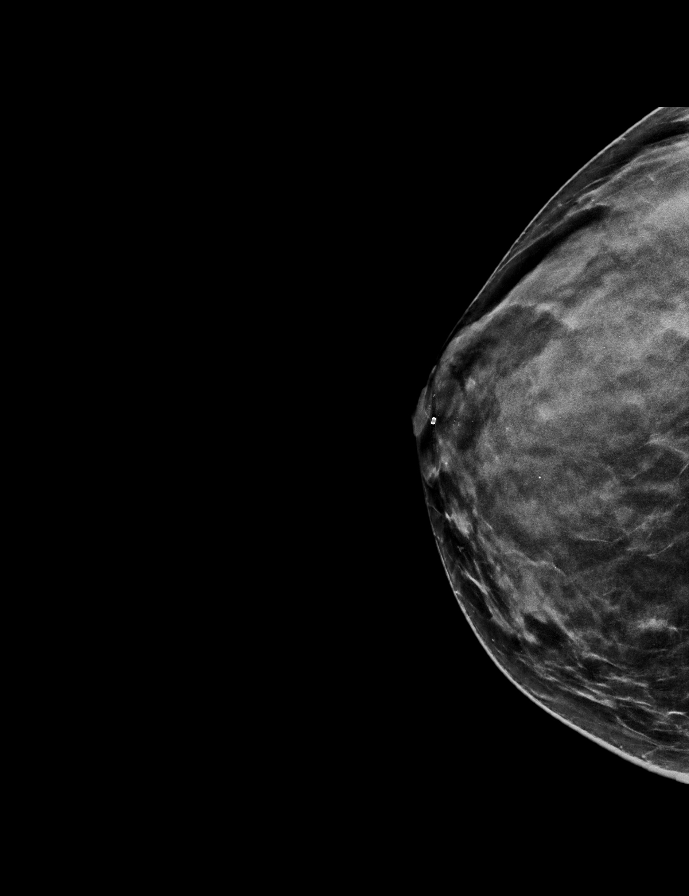

[L MLO synth-2D]
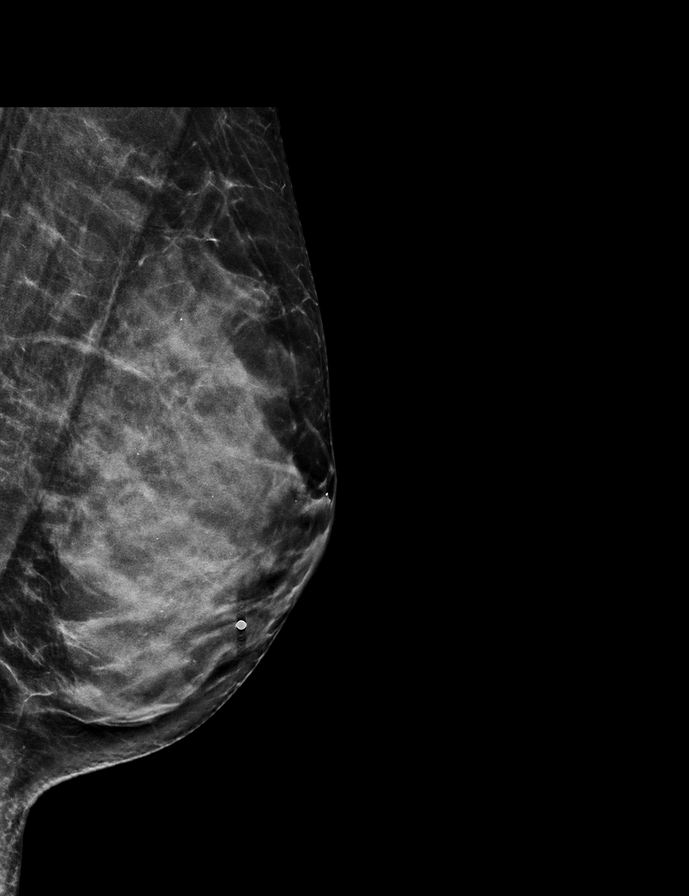

[L CC synth-2D]
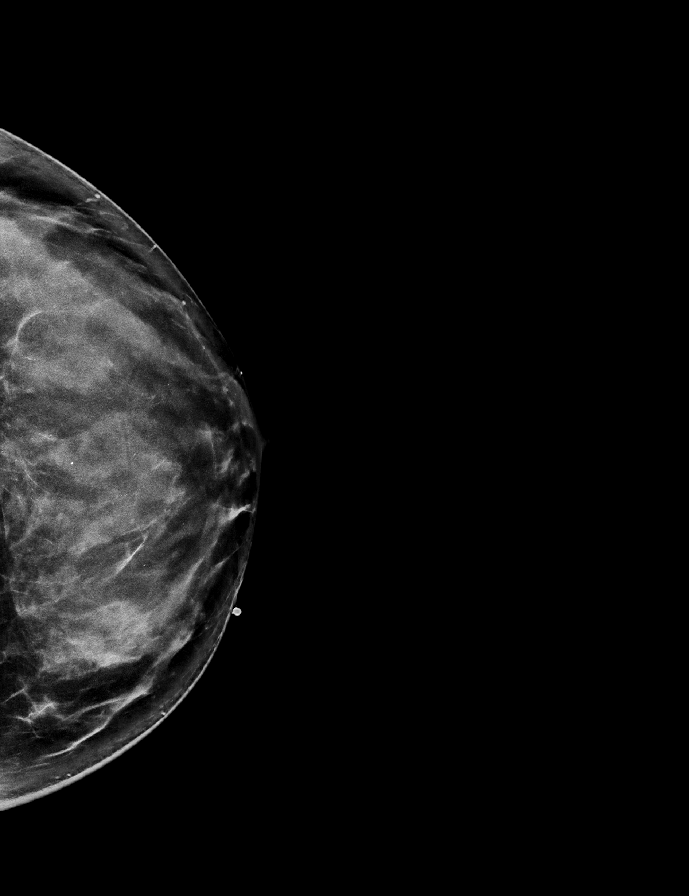

[R MLO synth-2D]
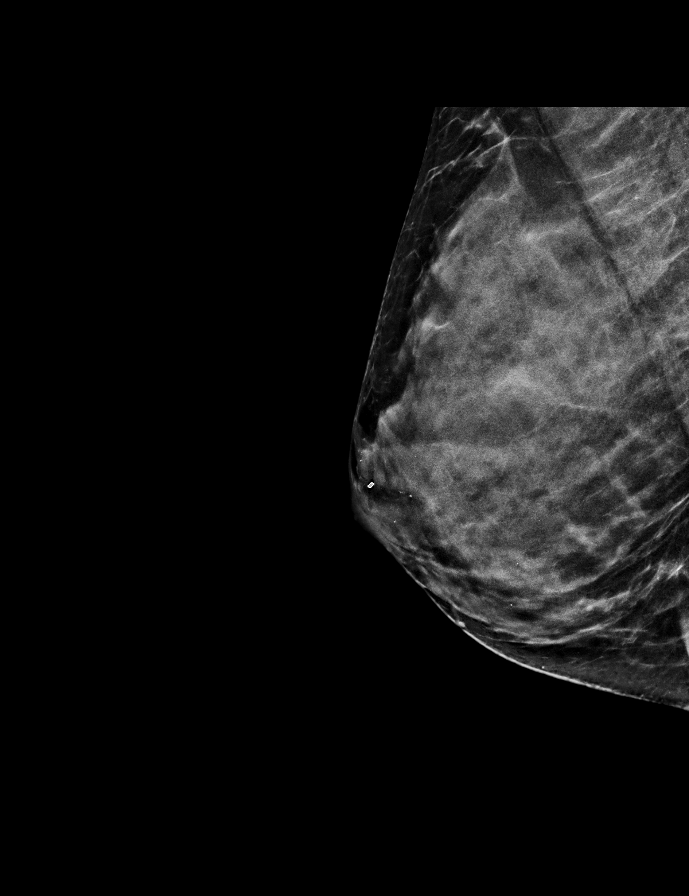

[6 of 32 positions shown; findings below may reference images not displayed]

[HOSPITAL] 05/14/2017 and was called back for evaluation of a mass
and calcifications. At that time the patient was moving to [HOSPITAL].
She had diagnostic mammogram, ultrasound, and RIGHT breast biopsy
there. She reports that the biopsy showed benign tissue without
malignancy. She also had a six-month follow-up of the RIGHT breast
in [HOSPITAL]. Previous images and report from [HOSPITAL] are not available.

Patient's mother was diagnosed with breast cancer at age 40.

EXAM:
DIGITAL DIAGNOSTIC BILATERAL MAMMOGRAM WITH CAD AND TOMO

ULTRASOUND BILATERAL BREAST
diagnostic evaluation
and biopsy images from [HOSPITAL] will be requested.

ACR Breast Density Category d: The breast tissue is extremely dense,
which lowers the sensitivity of mammography.
FINDINGS: Right breast:

Mammogram: In the immediate retroareolar region of the RIGHT breast
there is a group of fine pleomorphic calcifications which measures
1.4 x 0.9 x 2.0 centimeters. A possible mass is also identified in
the same location. A cylindrical clip is identified along the LOWER
MEDIAL aspect of the calcifications and mass following previous
biopsy. Mammographic images were processed with CAD.

Ultrasound: Targeted ultrasound is performed, showing a
circumscribed solid vascular tubular mass in the 12 o'clock
retroareolar region of the RIGHT breast. Mass measures 1.9 thigh
x 0.5 centimeters. Calcifications are identified within the mass,
correlating well with the mammographic appearance. Despite its
presence, the cylindrical clip is not visible sonographically.

Evaluation of the RIGHT axilla is negative for adenopathy.

Left breast:

Mammogram: Normal appearing fibroglandular tissue identified in the
LEFT breast at the site of patient's palpable abnormality. No
suspicious mass, distortion, or microcalcifications are identified
to suggest presence of malignancy. Mammographic images were
processed with CAD.

Ultrasound: Targeted ultrasound is performed, showing dense
fibroglandular tissue in the 8:30 o'clock location of the LEFT
breast, in the area of focal tenderness. No suspicious mass,
distortion, or acoustic shadowing is demonstrated with ultrasound.
IMPRESSION: 1. Suspicious microcalcifications and mass in the 12 o'clock
retroareolar region of the RIGHT breast. Previous biopsy was
reportedly benign in this region. Prior studies and reports will be
reviewed. I suspect that a repeat ultrasound guided core biopsy may
be indicated, and this has been scheduled for the patient in 2
weeks. At the time of biopsy, consider specimen radiographs of the
samples to determine if calcifications are present.
2. No suspicious findings in the LEFT breast at the site of focal
pain.

RECOMMENDATION:
1. Obtain prior reports and images from [HOSPITAL].
2. Consider repeat RIGHT ultrasound-guided core biopsy, scheduled
for 01/28/2019. If review of previous films and reports obviate the
need for biopsy, this appointment can be canceled.

I have discussed the findings and recommendations with the patient.
If applicable, a reminder letter will be sent to the patient
regarding the next appointment.

BI-RADS CATEGORY  4: Suspicious.

ADDENDUM:
Comparison is now made with multiple prior studies performed at
[HOSPITAL] [facility] dated 12/29/2016, 06/19/2016, 06/12/2017 in addition to
the screening study performed 05/14/2017 at [[facility]].

The mass and associated calcifications in the retroareolar region of
the RIGHT breast appear completely stable since previous diagnostic
evaluation performed in Saturday June, 2017. Subsequent ultrasound-guided
core biopsy showed benign fibroepithelial lesion consistent with
benign fibroadenoma. It was recommended that the patient return at
six-months for repeat RIGHT ultrasound which confirmed stability of
the mass in Monday December, 2017.

Given the stability of the benign mass and associated
calcifications, no further evaluation is felt to be necessary at
this time.

I spoke with the patient and we reviewed the stable imaging findings
and concordant pathology results. Given the stability, no biopsy is
felt to be necessary at this time. The patient concurs with this
plan. Given the patient's family history of breast cancer diagnosed
in her mother at age 40, I would recommend continued annual
screening mammograms.

BI-RADS category: 2: Benign.

*** End of Addendum ***
[HOSPITAL] 05/14/2017 and was called back for evaluation of a mass
and calcifications. At that time the patient was moving to [HOSPITAL].
She had diagnostic mammogram, ultrasound, and RIGHT breast biopsy
there. She reports that the biopsy showed benign tissue without
malignancy. She also had a six-month follow-up of the RIGHT breast
in [HOSPITAL]. Previous images and report from [HOSPITAL] are not available.

Patient's mother was diagnosed with breast cancer at age 40.

EXAM:
DIGITAL DIAGNOSTIC BILATERAL MAMMOGRAM WITH CAD AND TOMO

ULTRASOUND BILATERAL BREAST
diagnostic evaluation
and biopsy images from [HOSPITAL] will be requested.

ACR Breast Density Category d: The breast tissue is extremely dense,
which lowers the sensitivity of mammography.
FINDINGS: Right breast:

Mammogram: In the immediate retroareolar region of the RIGHT breast
there is a group of fine pleomorphic calcifications which measures
1.4 x 0.9 x 2.0 centimeters. A possible mass is also identified in
the same location. A cylindrical clip is identified along the LOWER
MEDIAL aspect of the calcifications and mass following previous
biopsy. Mammographic images were processed with CAD.

Ultrasound: Targeted ultrasound is performed, showing a
circumscribed solid vascular tubular mass in the 12 o'clock
retroareolar region of the RIGHT breast. Mass measures 1.9 thigh
x 0.5 centimeters. Calcifications are identified within the mass,
correlating well with the mammographic appearance. Despite its
presence, the cylindrical clip is not visible sonographically.

Evaluation of the RIGHT axilla is negative for adenopathy.

Left breast:

Mammogram: Normal appearing fibroglandular tissue identified in the
LEFT breast at the site of patient's palpable abnormality. No
suspicious mass, distortion, or microcalcifications are identified
to suggest presence of malignancy. Mammographic images were
processed with CAD.

Ultrasound: Targeted ultrasound is performed, showing dense
fibroglandular tissue in the 8:30 o'clock location of the LEFT
breast, in the area of focal tenderness. No suspicious mass,
distortion, or acoustic shadowing is demonstrated with ultrasound.
IMPRESSION: 1. Suspicious microcalcifications and mass in the 12 o'clock
retroareolar region of the RIGHT breast. Previous biopsy was
reportedly benign in this region. Prior studies and reports will be
reviewed. I suspect that a repeat ultrasound guided core biopsy may
be indicated, and this has been scheduled for the patient in 2
weeks. At the time of biopsy, consider specimen radiographs of the
samples to determine if calcifications are present.
2. No suspicious findings in the LEFT breast at the site of focal
pain.

RECOMMENDATION:
1. Obtain prior reports and images from [HOSPITAL].
2. Consider repeat RIGHT ultrasound-guided core biopsy, scheduled
for 01/28/2019. If review of previous films and reports obviate the
need for biopsy, this appointment can be canceled.

I have discussed the findings and recommendations with the patient.
If applicable, a reminder letter will be sent to the patient
regarding the next appointment.

BI-RADS CATEGORY  4: Suspicious.

## 2020-12-23 IMAGING — US US BREAST*R* LIMITED INC AXILLA
1 series · 10 of 10 positions shown · non-contrast
Comparison: Screening study on 05/14/2017;
COMPARISON: Screening study on 05/14/2017;

Addendum:
CLINICAL DATA: Focal pain in the LEFT breast.

[Series 1: us breast*right* limited inc axilla · 0.06mm/px · 10 of 10 slices shown]
[im 1/10]
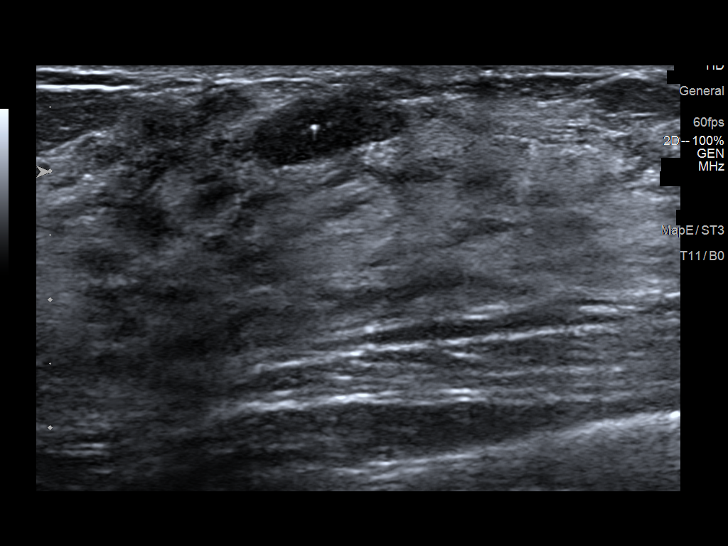
[im 2/10]
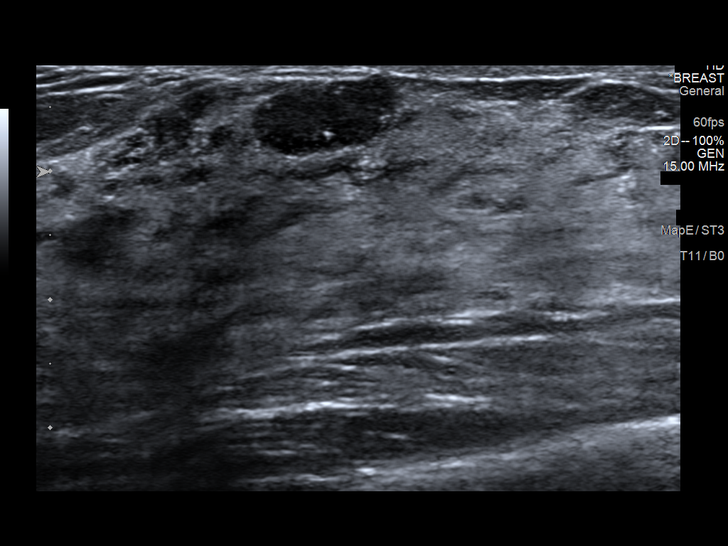
[im 3/10]
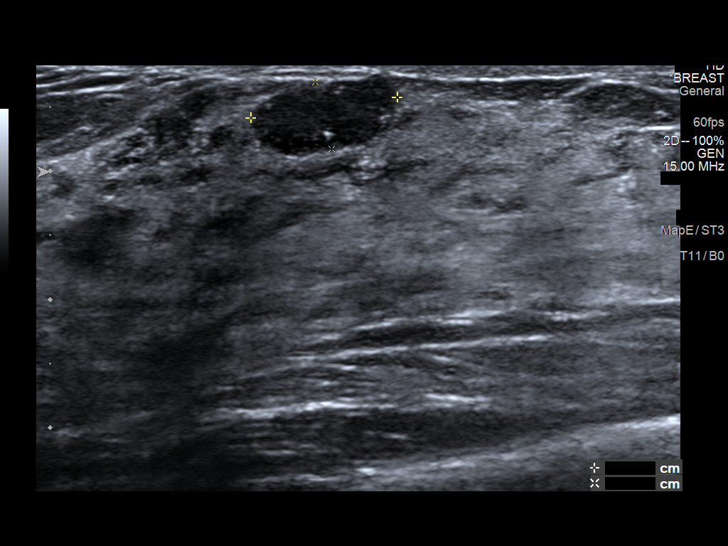
[im 4/10]
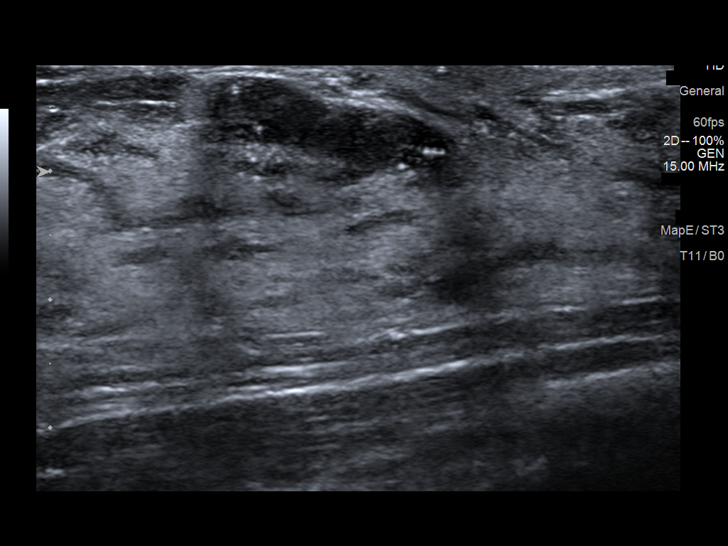
[im 5/10]
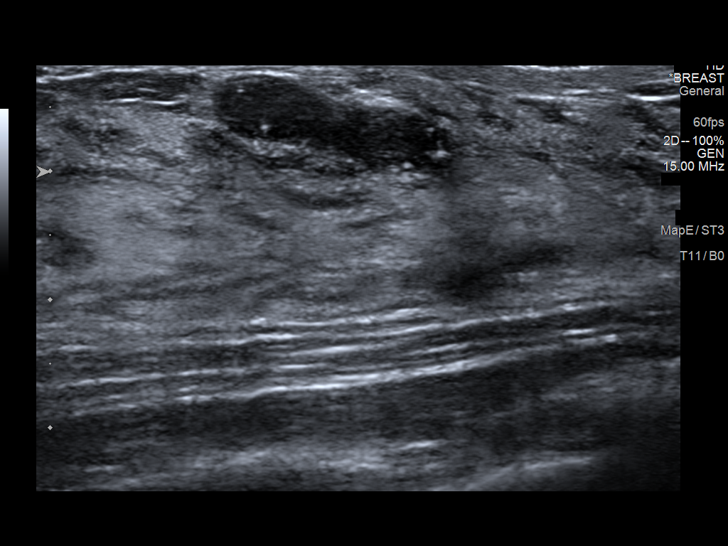
[im 6/10]
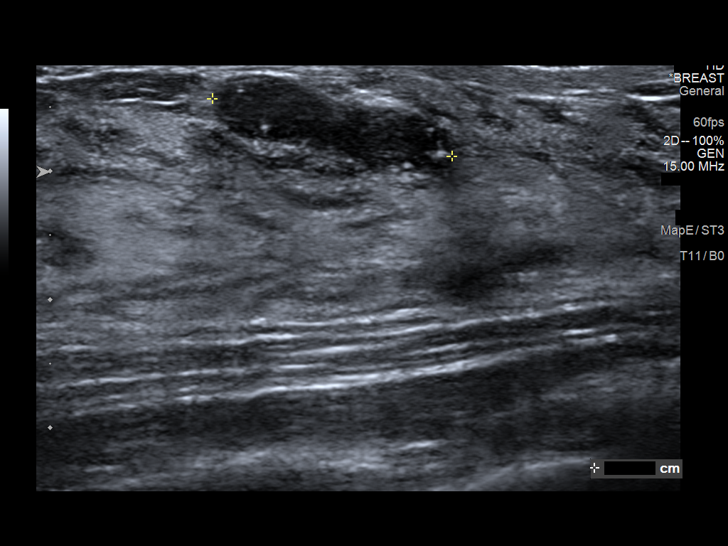
[im 7/10]
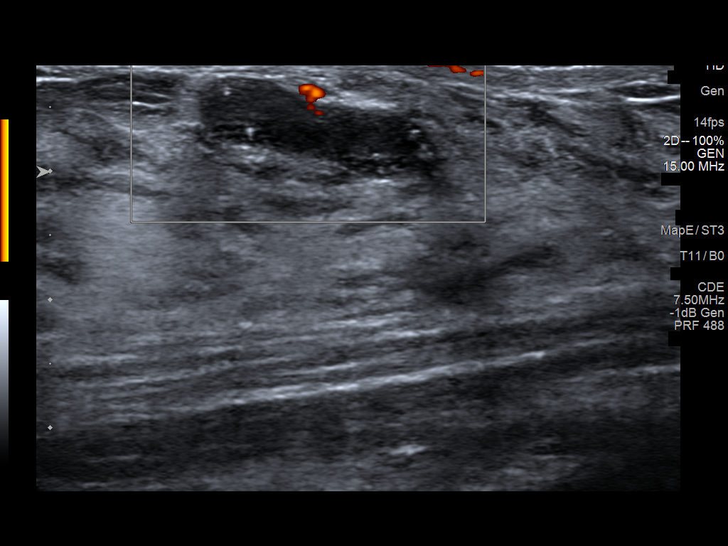
[im 8/10]
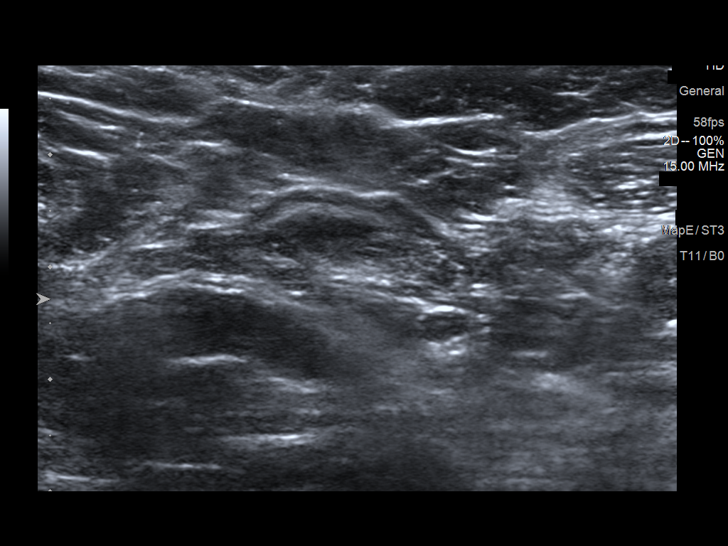
[im 9/10]
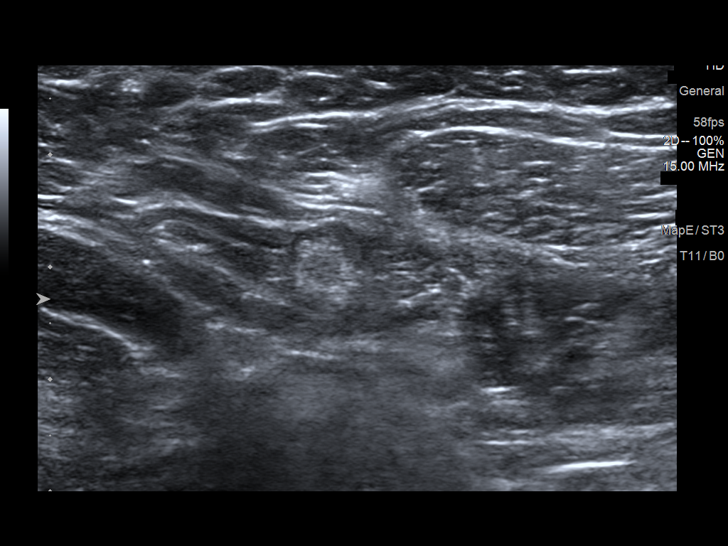
[im 10/10]
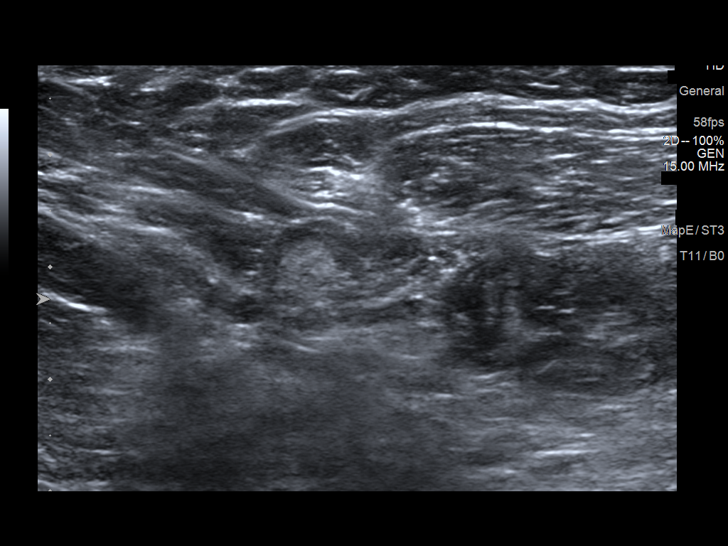

[10 of 10 positions shown; findings below may reference images not displayed]

[[facility]] 05/14/2017 and was called back for evaluation of a mass
and calcifications. At that time the patient was moving to [HOSPITAL].
She had diagnostic mammogram, ultrasound, and RIGHT breast biopsy
there. She reports that the biopsy showed benign tissue without
malignancy. She also had a six-month follow-up of the RIGHT breast
in [HOSPITAL]. Previous images and report from [HOSPITAL] are not available.

Patient's mother was diagnosed with breast cancer at age 40.

EXAM:
DIGITAL DIAGNOSTIC BILATERAL MAMMOGRAM WITH CAD AND TOMO

ULTRASOUND BILATERAL BREAST
diagnostic evaluation
and biopsy images from [HOSPITAL] will be requested.

ACR Breast Density Category d: The breast tissue is extremely dense,
which lowers the sensitivity of mammography.
FINDINGS: Right breast:

Mammogram: In the immediate retroareolar region of the RIGHT breast
there is a group of fine pleomorphic calcifications which measures
1.4 x 0.9 x 2.0 centimeters. A possible mass is also identified in
the same location. A cylindrical clip is identified along the LOWER
MEDIAL aspect of the calcifications and mass following previous
biopsy. Mammographic images were processed with CAD.

Ultrasound: Targeted ultrasound is performed, showing a
circumscribed solid vascular tubular mass in the 12 o'clock
retroareolar region of the RIGHT breast. Mass measures 1.9 thigh
x 0.5 centimeters. Calcifications are identified within the mass,
correlating well with the mammographic appearance. Despite its
presence, the cylindrical clip is not visible sonographically.

Evaluation of the RIGHT axilla is negative for adenopathy.

Left breast:

Mammogram: Normal appearing fibroglandular tissue identified in the
LEFT breast at the site of patient's palpable abnormality. No
suspicious mass, distortion, or microcalcifications are identified
to suggest presence of malignancy. Mammographic images were
processed with CAD.

Ultrasound: Targeted ultrasound is performed, showing dense
fibroglandular tissue in the 8:30 o'clock location of the LEFT
breast, in the area of focal tenderness. No suspicious mass,
distortion, or acoustic shadowing is demonstrated with ultrasound.
IMPRESSION: 1. Suspicious microcalcifications and mass in the 12 o'clock
retroareolar region of the RIGHT breast. Previous biopsy was
reportedly benign in this region. Prior studies and reports will be
reviewed. I suspect that a repeat ultrasound guided core biopsy may
be indicated, and this has been scheduled for the patient in 2
weeks. At the time of biopsy, consider specimen radiographs of the
samples to determine if calcifications are present.
2. No suspicious findings in the LEFT breast at the site of focal
pain.

RECOMMENDATION:
1. Obtain prior reports and images from [HOSPITAL].
2. Consider repeat RIGHT ultrasound-guided core biopsy, scheduled
for 01/28/2019. If review of previous films and reports obviate the
need for biopsy, this appointment can be canceled.

I have discussed the findings and recommendations with the patient.
If applicable, a reminder letter will be sent to the patient
regarding the next appointment.

BI-RADS CATEGORY  4: Suspicious.

ADDENDUM:
Comparison is now made with multiple prior studies performed at
[facility] dated 12/29/2016, 06/19/2016, 06/12/2017 in addition to
the screening study performed 05/14/2017 at [[email or web address]].

The mass and associated calcifications in the retroareolar region of
the RIGHT breast appear completely stable since previous diagnostic
evaluation performed in Saturday June, 2017. Subsequent ultrasound-guided
core biopsy showed benign fibroepithelial lesion consistent with
benign fibroadenoma. It was recommended that the patient return at
six-months for repeat RIGHT ultrasound which confirmed stability of
the mass in Monday December, 2017.

Given the stability of the benign mass and associated
calcifications, no further evaluation is felt to be necessary at
this time.

I spoke with the patient and we reviewed the stable imaging findings
and concordant pathology results. Given the stability, no biopsy is
felt to be necessary at this time. The patient concurs with this
plan. Given the patient's family history of breast cancer diagnosed
in her mother at age 40, I would recommend continued annual
screening mammograms.

BI-RADS category: 2: Benign.

*** End of Addendum ***
[[facility]OS[facility]ITAL] 05/14/2017 and was called back for evaluation of a mass
and calcifications. At that time the patient was moving to [HOSPITAL].
She had diagnostic mammogram, ultrasound, and RIGHT breast biopsy
there. She reports that the biopsy showed benign tissue without
malignancy. She also had a six-month follow-up of the RIGHT breast
in [[facility]OS[facility]ITAL]. Previous images and report from [HOSPITAL] are not available.

Patient's mother was diagnosed with breast cancer at age 40.

EXAM:
DIGITAL DIAGNOSTIC BILATERAL MAMMOGRAM WITH CAD AND TOMO

ULTRASOUND BILATERAL BREAST
diagnostic evaluation
and biopsy images from [HOSPITAL] will be requested.

ACR Breast Density Category d: The breast tissue is extremely dense,
which lowers the sensitivity of mammography.
FINDINGS: Right breast:

Mammogram: In the immediate retroareolar region of the RIGHT breast
there is a group of fine pleomorphic calcifications which measures
1.4 x 0.9 x 2.0 centimeters. A possible mass is also identified in
the same location. A cylindrical clip is identified along the LOWER
MEDIAL aspect of the calcifications and mass following previous
biopsy. Mammographic images were processed with CAD.

Ultrasound: Targeted ultrasound is performed, showing a
circumscribed solid vascular tubular mass in the 12 o'clock
retroareolar region of the RIGHT breast. Mass measures 1.9 thigh
x 0.5 centimeters. Calcifications are identified within the mass,
correlating well with the mammographic appearance. Despite its
presence, the cylindrical clip is not visible sonographically.

Evaluation of the RIGHT axilla is negative for adenopathy.

Left breast:

Mammogram: Normal appearing fibroglandular tissue identified in the
LEFT breast at the site of patient's palpable abnormality. No
suspicious mass, distortion, or microcalcifications are identified
to suggest presence of malignancy. Mammographic images were
processed with CAD.

Ultrasound: Targeted ultrasound is performed, showing dense
fibroglandular tissue in the 8:30 o'clock location of the LEFT
breast, in the area of focal tenderness. No suspicious mass,
distortion, or acoustic shadowing is demonstrated with ultrasound.
IMPRESSION: 1. Suspicious microcalcifications and mass in the 12 o'clock
retroareolar region of the RIGHT breast. Previous biopsy was
reportedly benign in this region. Prior studies and reports will be
reviewed. I suspect that a repeat ultrasound guided core biopsy may
be indicated, and this has been scheduled for the patient in 2
weeks. At the time of biopsy, consider specimen radiographs of the
samples to determine if calcifications are present.
2. No suspicious findings in the LEFT breast at the site of focal
pain.

RECOMMENDATION:
1. Obtain prior reports and images from [HOSPITAL].
2. Consider repeat RIGHT ultrasound-guided core biopsy, scheduled
for 01/28/2019. If review of previous films and reports obviate the
need for biopsy, this appointment can be canceled.

I have discussed the findings and recommendations with the patient.
If applicable, a reminder letter will be sent to the patient
regarding the next appointment.

BI-RADS CATEGORY  4: Suspicious.
